# Patient Record
Sex: Female | Born: 1995 | Race: Black or African American | Hispanic: No | Marital: Single | Smoking: Current every day smoker
Health system: Southern US, Community
[De-identification: ages and names within clinical notes are randomized; demographics above are authoritative.]

---
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
## 2016-02-10 IMAGING — CR DG SHOULDER 2+V*R*
2 series · 2 of 2 positions shown · non-contrast
Comparison: None in PACs

CLINICAL DATA: Right shoulder injury while throwing a snow ball
today; history of right shoulder instability.

EXAM:
RIGHT SHOULDER - 2+ VIEW

[w shoulder external right]
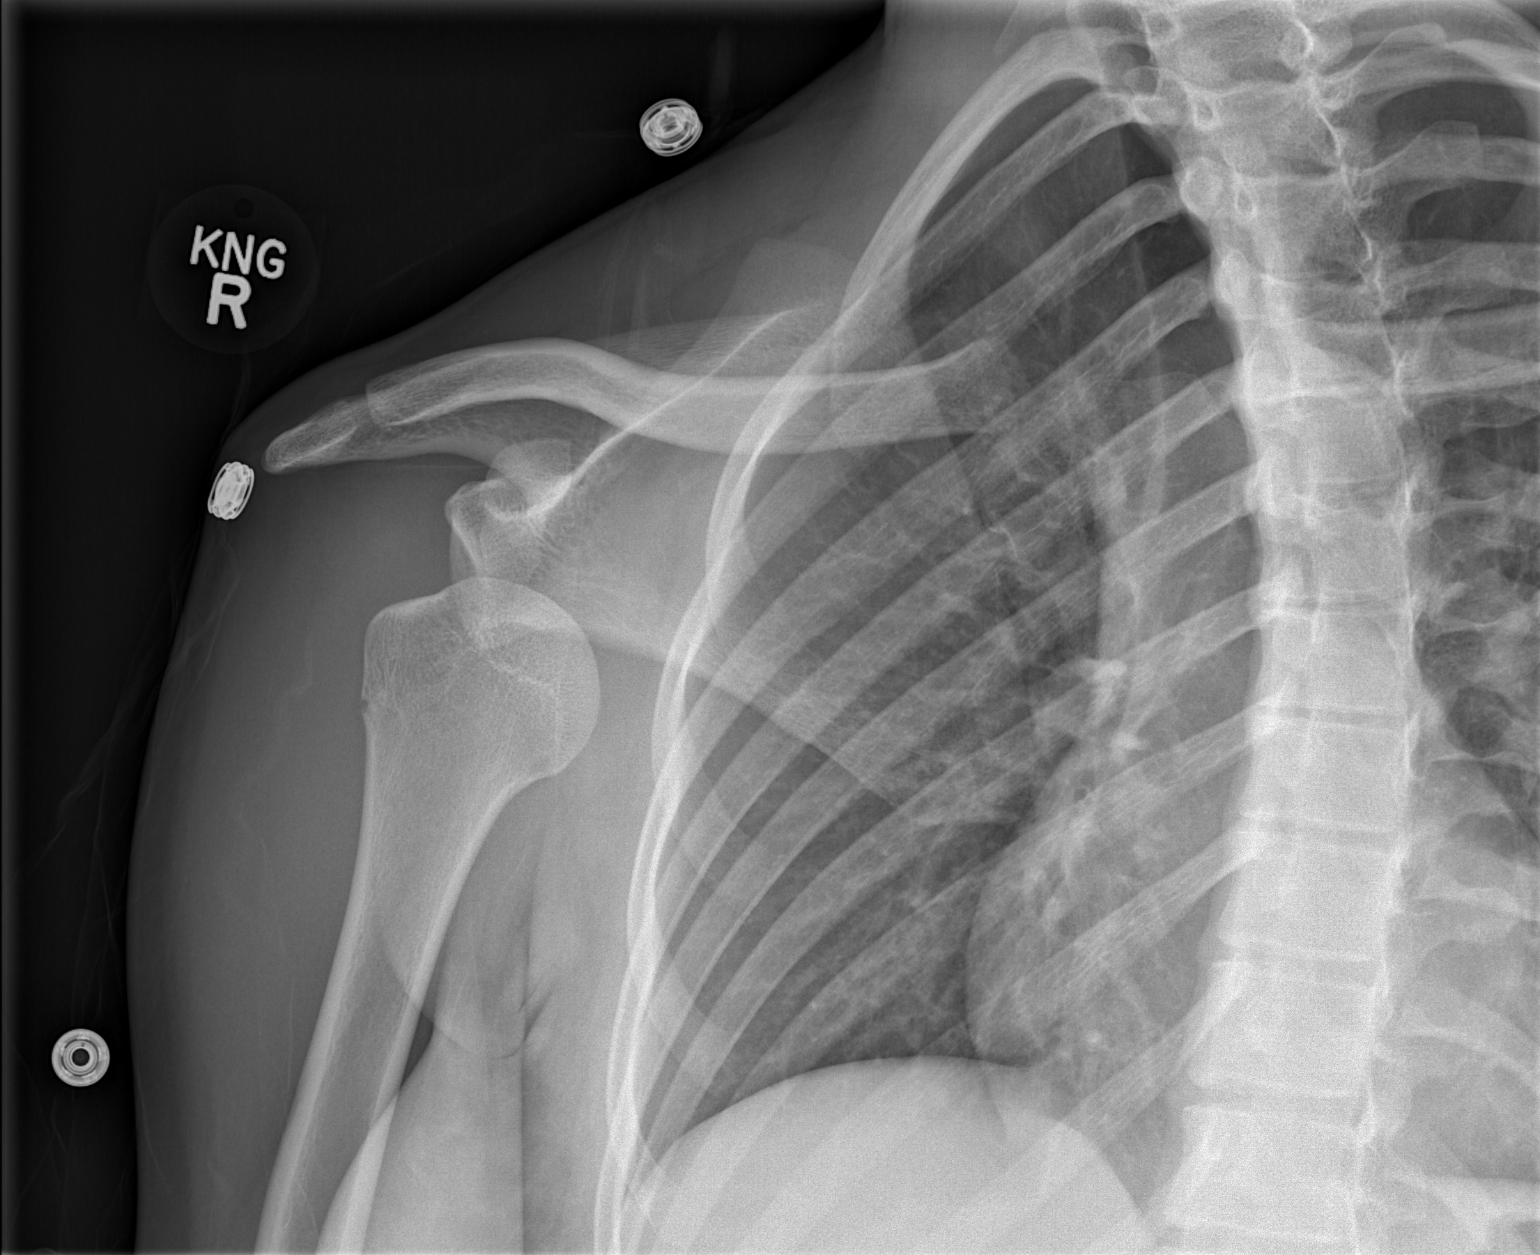

[w shoulder y-view right]
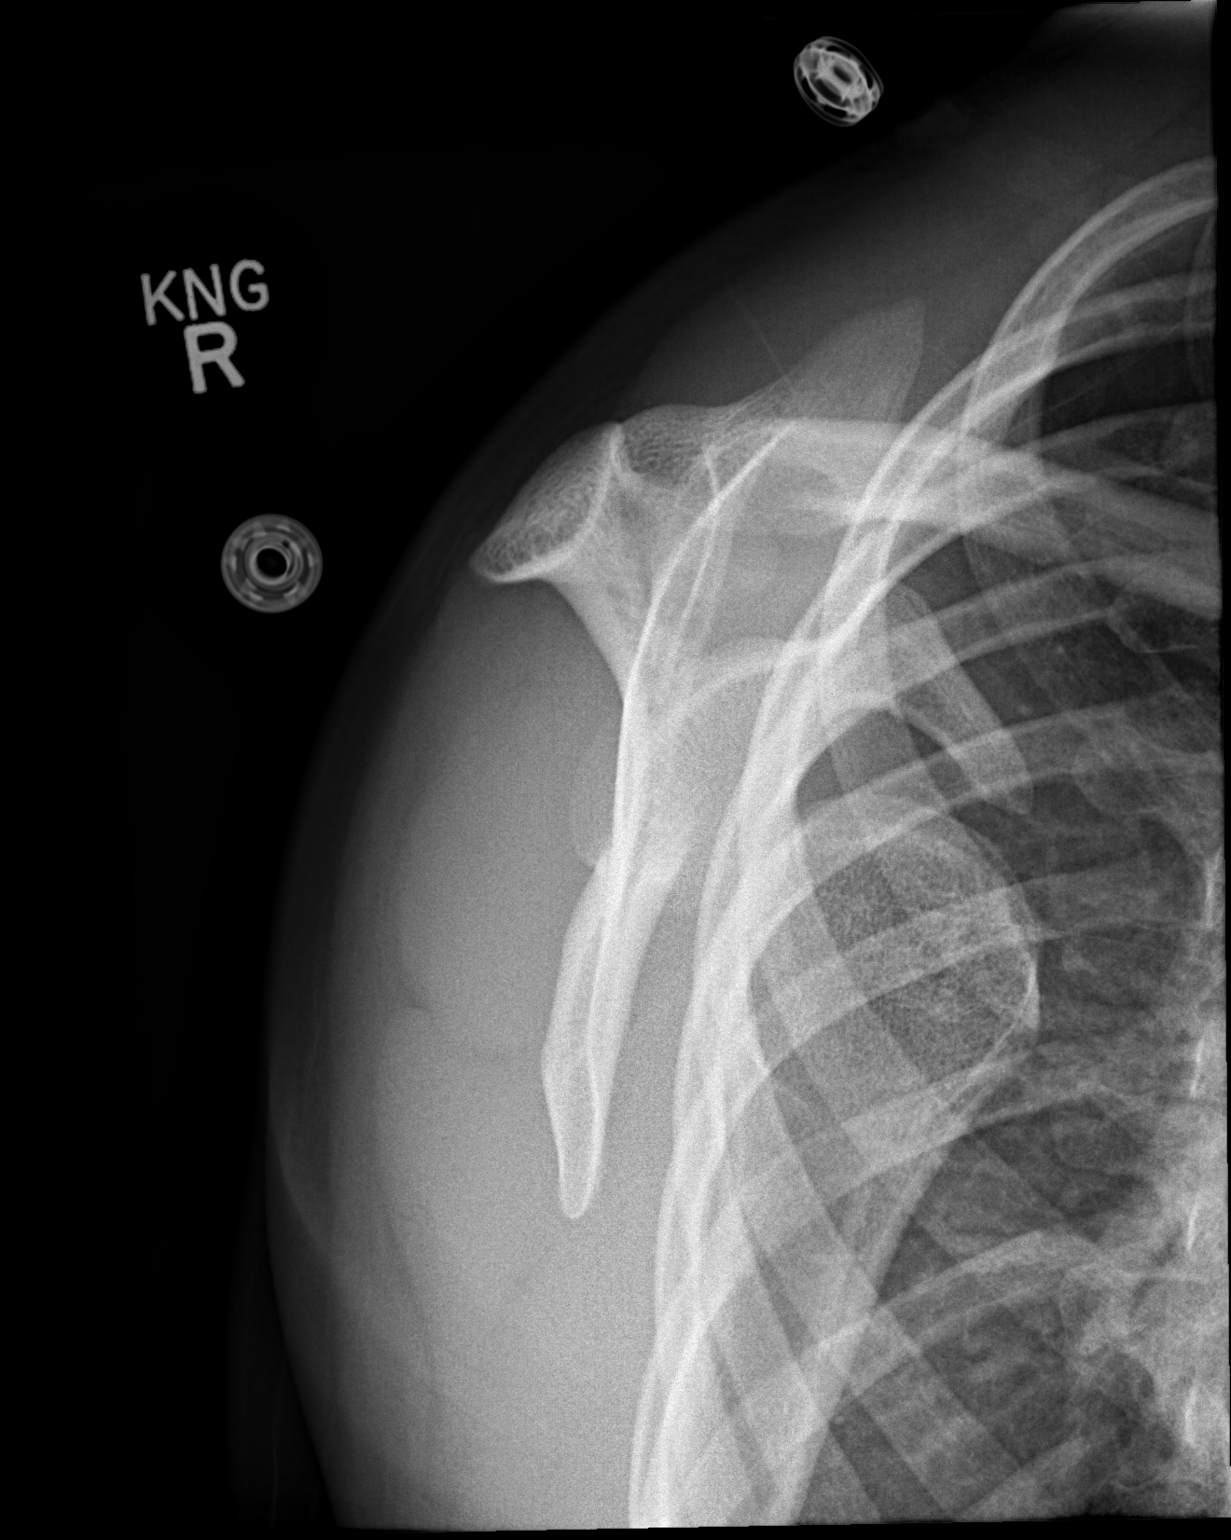

[2 of 2 positions shown; findings below may reference images not displayed]

FINDINGS: The patient is sustained anterior and likely inferior dislocation of
the humeral head with respect to the glenoid. No acute fracture is
observed. The clavicle and AC joint are unremarkable. The observed
portions of the upper right ribs are normal.
IMPRESSION: The patient has sustained acute anterior and inferior dislocation of
the right humeral head with respect to the glenoid.

## 2016-04-19 ENCOUNTER — Emergency Department (HOSPITAL_COMMUNITY)
Admission: EM | Admit: 2016-04-19 | Discharge: 2016-04-19 | Disposition: A | Discharge status: Home / Self Care | Payer: Medicaid Other | Attending: Emergency Medicine | Admitting: Emergency Medicine

## 2016-04-19 ENCOUNTER — Encounter (HOSPITAL_COMMUNITY): Payer: Self-pay | Admitting: Emergency Medicine

## 2016-04-19 DIAGNOSIS — J3489 Other specified disorders of nose and nasal sinuses: Secondary | ICD-10-CM

## 2016-04-19 DIAGNOSIS — Y939 Activity, unspecified: Secondary | ICD-10-CM | POA: Insufficient documentation

## 2016-04-19 DIAGNOSIS — Y929 Unspecified place or not applicable: Secondary | ICD-10-CM | POA: Insufficient documentation

## 2016-04-19 DIAGNOSIS — Y999 Unspecified external cause status: Secondary | ICD-10-CM | POA: Insufficient documentation

## 2016-04-19 DIAGNOSIS — F17200 Nicotine dependence, unspecified, uncomplicated: Secondary | ICD-10-CM | POA: Insufficient documentation

## 2016-04-19 DIAGNOSIS — S0083XA Contusion of other part of head, initial encounter: Secondary | ICD-10-CM | POA: Insufficient documentation
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
## 2016-04-19 NOTE — Discharge Instructions (Signed)
Use Ibuprofen or Tylenol for pain. Get plenty of rest, use ice on your head/face to help with pain/swelling/nosebleeds. DO NOT BLOW YOUR NOSE until swelling has subsided.  Stay in a quiet, not simulating, dark environment; No TV, computer use, video games, or cell phone use until headache is resolved completely. Follow Up with primary care physician in 3-5 days for recheck of symptoms.  Return to the emergency department if patient becomes lethargic, begins vomiting or other change in mental status, or any other changes/worsening symptoms.

## 2016-04-19 NOTE — ED Notes (Signed)
Patient was alert, oriented and stable upon discharge. RN went over AVS and patient had no further questions.  
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
## 2017-05-17 IMAGING — US US ABDOMEN COMPLETE
1 series · 14 of 25 positions shown · non-contrast
Comparison: None.

CLINICAL DATA: Acute onset of right upper quadrant abdominal pain.
Initial encounter.

EXAM:
ULTRASOUND ABDOMEN COMPLETE

[Series 1: us abdomen complete · 0.11mm/px · 14 of 75 slices shown]
[im 1/75]
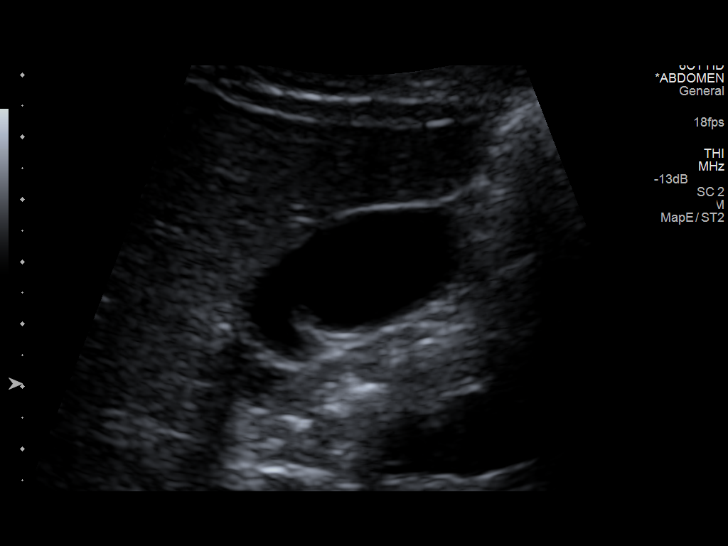
[im 7/75]
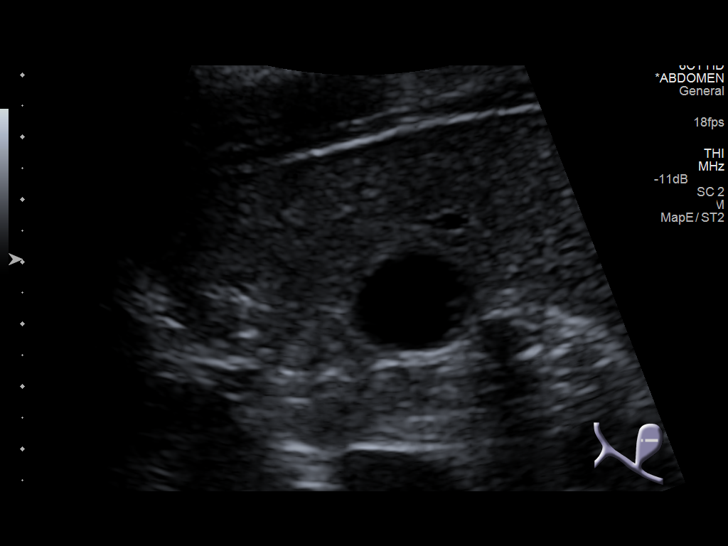
[im 13/75]
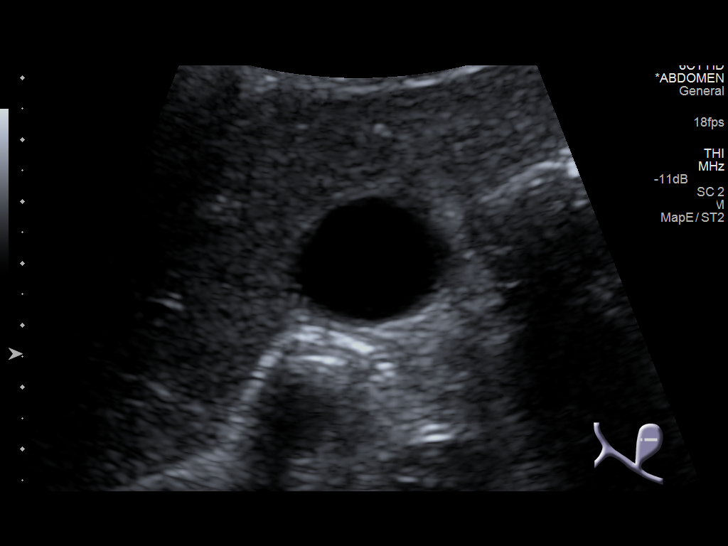
[im 19/75]
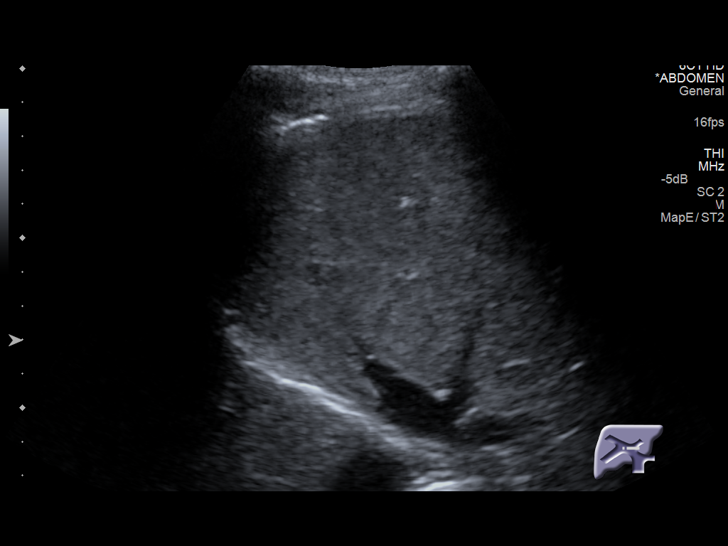
[im 25/75]
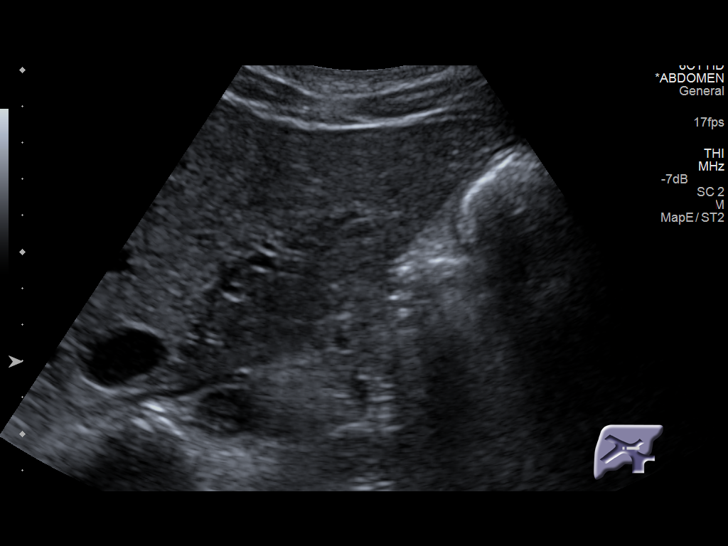
[im 28/75]
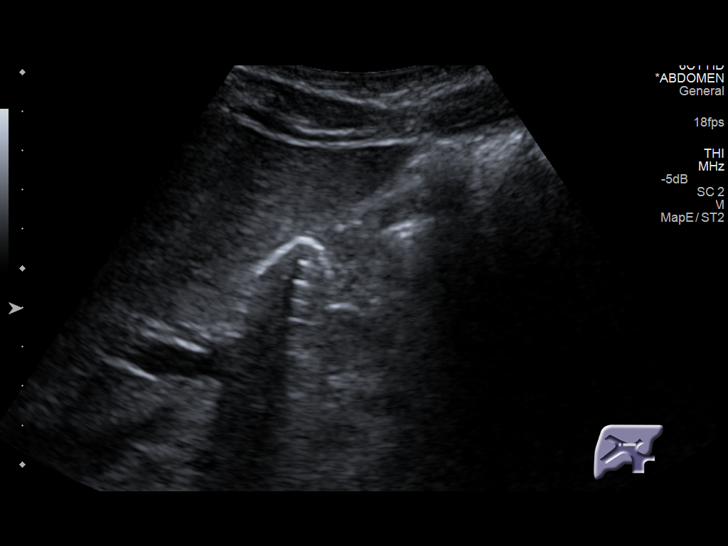
[im 34/75]
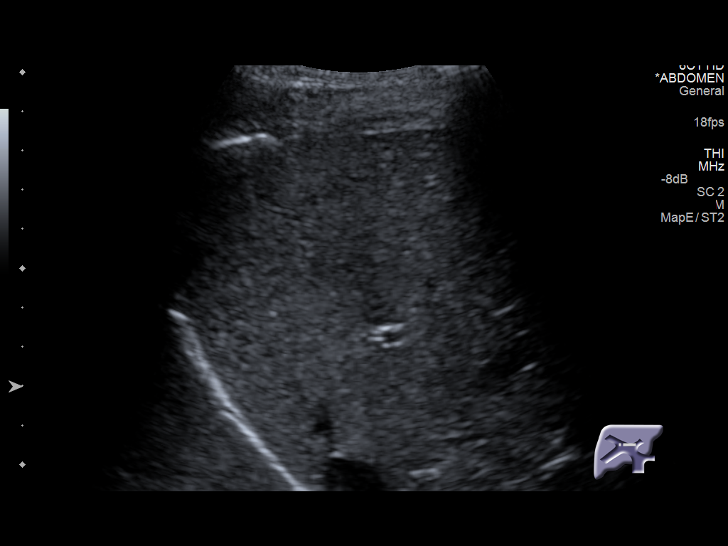
[im 41/75]
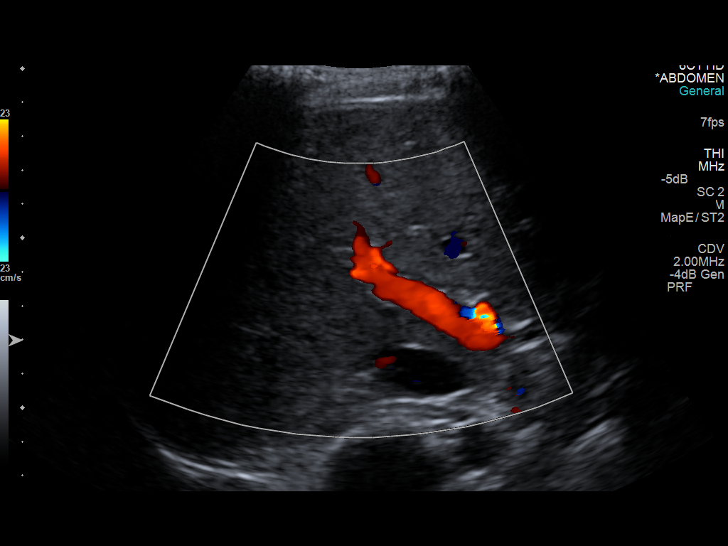
[im 47/75]
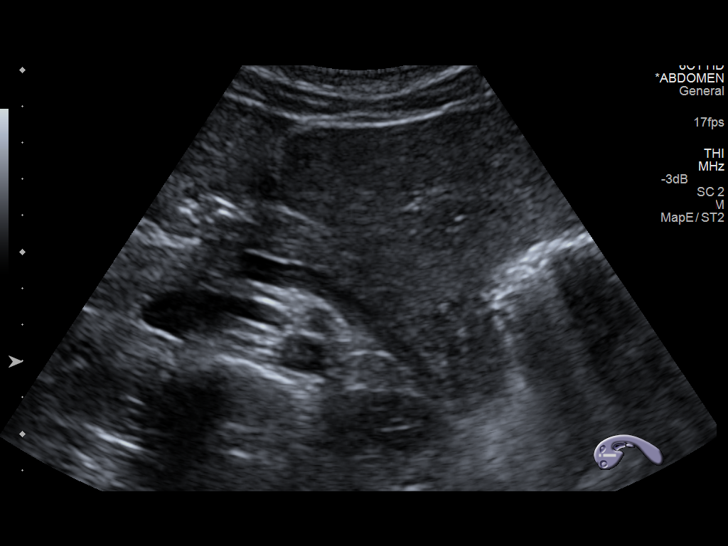
[im 50/75]
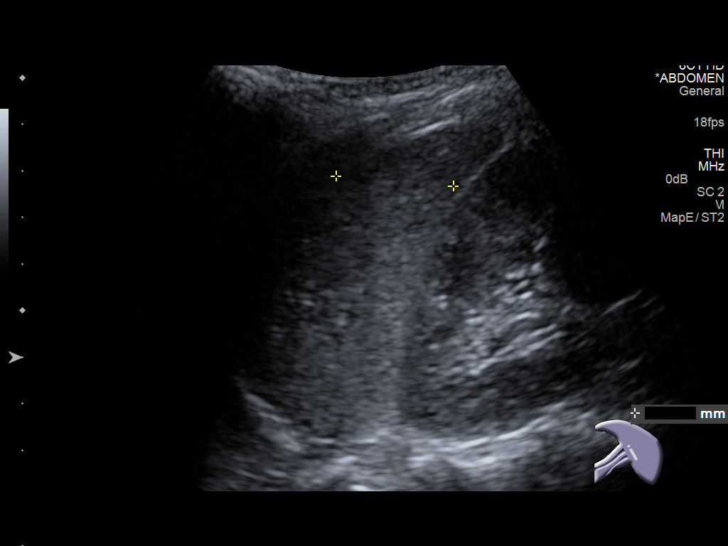
[im 56/75]
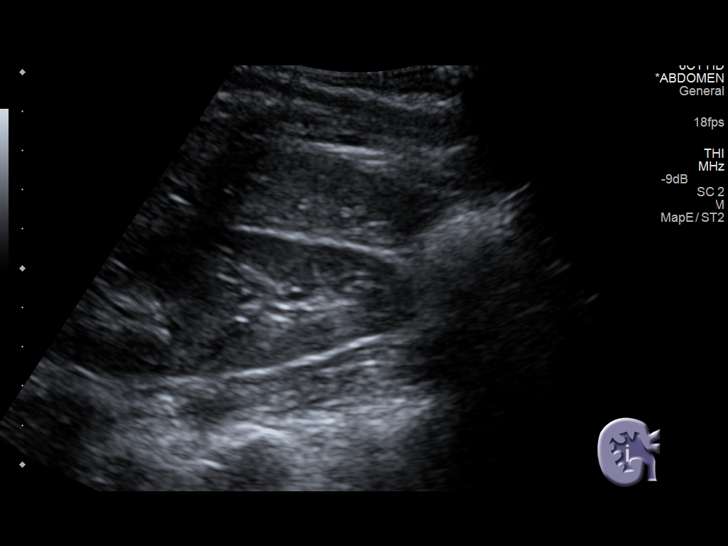
[im 62/75]
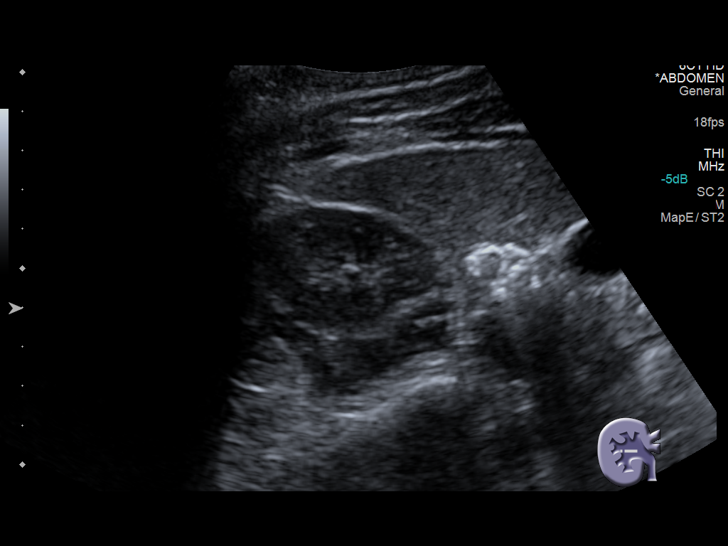
[im 68/75]
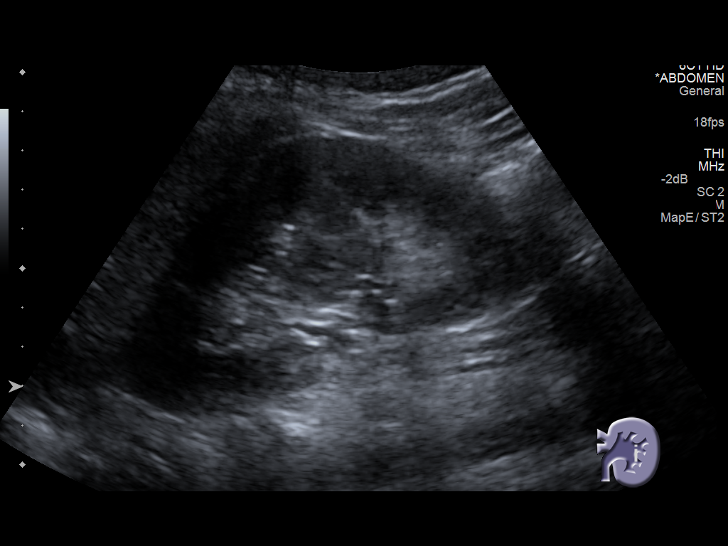
[im 75/75]
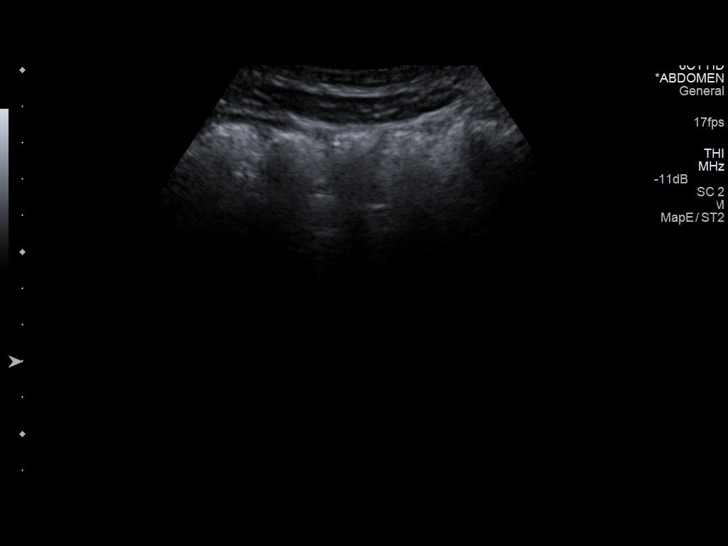

[14 of 25 positions shown; findings below may reference images not displayed]

FINDINGS: Gallbladder: No gallstones or wall thickening visualized. No
sonographic Murphy sign noted.

Common bile duct: Diameter: 0.3 cm, within normal limits in caliber.

Liver: No focal lesion identified. Within normal limits in
parenchymal echogenicity.

IVC: No abnormality visualized.

Pancreas: Visualized portion unremarkable.

Spleen: Size and appearance within normal limits.

Right Kidney: Length: 11.5 cm. Echogenicity within normal limits. No
mass or hydronephrosis visualized.

Left Kidney: Length: 10.5 cm. Echogenicity within normal limits. No
mass or hydronephrosis visualized.

Abdominal aorta: No aneurysm visualized. The distal abdominal aorta
and common iliac arteries are obscured by overlying bowel gas.

Other findings: None.
IMPRESSION: Unremarkable abdominal ultrasound.
